# Patient Record
Sex: Male | Born: 1995 | Race: White | Hispanic: No | Marital: Single | Smoking: Former smoker
Health system: Southern US, Community
[De-identification: ages and names within clinical notes are randomized; demographics above are authoritative.]

## PROBLEM LIST (undated history)

## (undated) HISTORY — PX: TYMPANOSTOMY TUBE PLACEMENT: SHX32

---
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
## 2017-06-13 ENCOUNTER — Encounter (HOSPITAL_COMMUNITY): Payer: Self-pay | Admitting: *Deleted

## 2017-06-13 ENCOUNTER — Ambulatory Visit (HOSPITAL_COMMUNITY)
Admission: EM | Admit: 2017-06-13 | Discharge: 2017-06-13 | Disposition: A | Discharge status: Nursing Home (Includes ICF) | Payer: Medicaid Other | Attending: Family Medicine | Admitting: Family Medicine

## 2017-06-13 ENCOUNTER — Ambulatory Visit (INDEPENDENT_AMBULATORY_CARE_PROVIDER_SITE_OTHER): Payer: Medicaid Other

## 2017-06-13 ENCOUNTER — Encounter (HOSPITAL_COMMUNITY): Payer: Self-pay | Admitting: Emergency Medicine

## 2017-06-13 ENCOUNTER — Emergency Department (HOSPITAL_COMMUNITY): Payer: Medicaid Other

## 2017-06-13 ENCOUNTER — Emergency Department (HOSPITAL_COMMUNITY)
Admission: EM | Admit: 2017-06-13 | Discharge: 2017-06-13 | Disposition: A | Discharge status: Home / Self Care | Payer: Medicaid Other | Attending: Emergency Medicine | Admitting: Emergency Medicine

## 2017-06-13 ENCOUNTER — Other Ambulatory Visit: Payer: Self-pay

## 2017-06-13 DIAGNOSIS — Z87891 Personal history of nicotine dependence: Secondary | ICD-10-CM | POA: Insufficient documentation

## 2017-06-13 DIAGNOSIS — R109 Unspecified abdominal pain: Secondary | ICD-10-CM | POA: Diagnosis present

## 2017-06-13 DIAGNOSIS — R0789 Other chest pain: Secondary | ICD-10-CM

## 2017-06-13 DIAGNOSIS — J982 Interstitial emphysema: Secondary | ICD-10-CM | POA: Insufficient documentation

## 2017-06-13 DIAGNOSIS — T797XXA Traumatic subcutaneous emphysema, initial encounter: Secondary | ICD-10-CM | POA: Diagnosis not present

## 2017-06-13 DIAGNOSIS — M542 Cervicalgia: Secondary | ICD-10-CM | POA: Diagnosis not present

## 2017-06-13 DIAGNOSIS — R05 Cough: Secondary | ICD-10-CM | POA: Diagnosis not present

## 2017-06-13 DIAGNOSIS — R079 Chest pain, unspecified: Secondary | ICD-10-CM | POA: Diagnosis not present

## 2017-06-13 LAB — CBC
HCT: 42 % (ref 39.0–52.0)
Hemoglobin: 14.3 g/dL (ref 13.0–17.0)
MCH: 31.8 pg (ref 26.0–34.0)
MCHC: 34 g/dL (ref 30.0–36.0)
MCV: 93.5 fL (ref 78.0–100.0)
PLATELETS: 157 10*3/uL (ref 150–400)
RBC: 4.49 MIL/uL (ref 4.22–5.81)
RDW: 13 % (ref 11.5–15.5)
WBC: 7.4 10*3/uL (ref 4.0–10.5)

## 2017-06-13 LAB — BASIC METABOLIC PANEL
Anion gap: 7 (ref 5–15)
BUN: 8 mg/dL (ref 6–20)
CHLORIDE: 108 mmol/L (ref 101–111)
CO2: 24 mmol/L (ref 22–32)
CREATININE: 0.97 mg/dL (ref 0.61–1.24)
Calcium: 9.3 mg/dL (ref 8.9–10.3)
GFR calc Af Amer: >60 mL/min (ref >60)
GFR calc non Af Amer: >60 mL/min (ref >60)
Glucose, Bld: 104 mg/dL — ABNORMAL HIGH (ref 65–99)
Potassium: 4 mmol/L (ref 3.5–5.1)
SODIUM: 139 mmol/L (ref 135–145)

## 2017-06-13 MED ORDER — MORPHINE SULFATE (PF) 4 MG/ML IV SOLN
4.0000 mg | Freq: Once | INTRAVENOUS | Status: AC
  Administered 2017-06-13: 4 mg via INTRAVENOUS
  Filled 2017-06-13: qty 1

## 2017-06-13 MED ORDER — KETOROLAC TROMETHAMINE 30 MG/ML IJ SOLN
30.0000 mg | Freq: Once | INTRAMUSCULAR | Status: AC
  Administered 2017-06-13: 30 mg via INTRAVENOUS
  Filled 2017-06-13: qty 1

## 2017-06-13 MED ORDER — OXYCODONE-ACETAMINOPHEN 5-325 MG PO TABS: 1.0000 | ORAL_TABLET | ORAL | 0 refills | Status: AC | PRN

## 2017-06-13 MED ORDER — ONDANSETRON HCL 4 MG/2ML IJ SOLN
4.0000 mg | Freq: Once | INTRAMUSCULAR | Status: AC
  Administered 2017-06-13: 4 mg via INTRAVENOUS
  Filled 2017-06-13: qty 2

## 2017-06-13 MED ORDER — IOPAMIDOL (ISOVUE-300) INJECTION 61%
INTRAVENOUS | Status: AC
  Administered 2017-06-13: 100 mL
  Filled 2017-06-13: qty 100

## 2017-06-13 NOTE — ED Notes (Signed)
Patient transported to CT 

## 2017-06-13 NOTE — ED Triage Notes (Addendum)
Pt reports a cough x1 week.  He states he started having chest pressure last night and anterior neck pain.  He also reports loss of hearing in his right ear, stating "it feels like there is a cup over it."  Pt denies any fever.  He states he is suppressing his cough because of the pain and finds it hard to take in a deep breath because of the pain in his chest.  Pt took cocaine and methamphetamines about a week and a half ago for the first time.

## 2017-06-13 NOTE — ED Notes (Signed)
Pt stable, ambulatory, states understanding of discharge instructions 

## 2017-06-13 NOTE — ED Provider Notes (Signed)
[facility] EMERGENCY DEPARTMENT Provider Note   CSN: [id number] Arrival date & time: 06/13/17  1329     History   Chief Complaint Chief Complaint  Patient presents with  . Chest Pain    HPI George Callahan is a 21 y.o. male.  Patient is a transfer from urgent care center.  He reports chest pain and a sensation of crackling in his neck after lifting weights recently.  He does smoke cigarettes and drinks rarely.  Otherwise he is healthy.  Chest x-ray at the urgent care center revealed pneumomediastinum.  He was referred to the emergency department for further evaluation.  Severity of symptoms is moderate.  Nothing makes symptoms better or worse.  No crushing substernal chest pain, dyspnea, diaphoresis, nausea.      History reviewed. No pertinent past medical history.  There are no active problems to display for this patient.   Past Surgical History:  Procedure Laterality Date  . TYMPANOSTOMY TUBE PLACEMENT Bilateral        Home Medications    Prior to Admission medications   Medication Sig Start Date End Date Taking? Authorizing Provider  ibuprofen (ADVIL,MOTRIN) 600 MG tablet Take 600 mg every 6 (six) hours as needed by mouth.   Yes [provider]  Multiple Vitamin (MULTIVITAMIN) tablet Take 1 tablet daily by mouth.   Yes [provider]  naproxen (NAPROSYN) 500 MG tablet Take 1 tablet (500 mg total) by mouth 2 (two) times daily. Patient not taking: Reported on 06/13/2017 08/18/16   Fayrene Helperran, [place], PA-C  oxyCODONE-acetaminophen (PERCOCET) 5-325 MG tablet Take 1 tablet every 4 (four) hours as needed by mouth. 06/13/17   Donnetta Hutchingook, Amity Roes, MD    Family History History reviewed. No pertinent family history.  Social History Social History   Tobacco Use  . Smoking status: Former Smoker    Packs/day: 0.50    Types: Cigarettes    Last attempt to quit: 05/30/2017    Years since quitting: 0.0  . Smokeless tobacco: Never Used  Substance Use  Topics  . Alcohol use: Yes    Comment: occasional  . Drug use: Yes    Types: Methamphetamines, Cocaine    Comment: states it was one time     Allergies   Patient has no known allergies.   Review of Systems Review of Systems  All other systems reviewed and are negative.    Physical Exam Updated Vital Signs BP 120/74 (BP Location: Right Arm)   Pulse 88   Temp 98.6 F (37 C) (Oral)   Resp 16   SpO2 100%   Physical Exam  Constitutional: He is oriented to person, place, and time. He appears well-developed and well-nourished.  nad  HENT:  Head: Normocephalic and atraumatic.  Eyes: Conjunctivae are normal.  Neck: Neck supple.  Crepitus in the neck noted.  Cardiovascular: Normal rate and regular rhythm.  Pulmonary/Chest: Effort normal and breath sounds normal.  Abdominal: Soft. Bowel sounds are normal.  Musculoskeletal: Normal range of motion.  Neurological: He is alert and oriented to person, place, and time.  Skin: Skin is warm and dry.  Psychiatric: He has a normal mood and affect. His behavior is normal.  Nursing note and vitals reviewed.    ED Treatments / Results  Labs (all labs ordered are listed, but only abnormal results are displayed) Labs Reviewed  BASIC METABOLIC PANEL - Abnormal; Notable for the following components:      Result Value   Glucose, Bld 104 (*)  All other components within normal limits  CBC    EKG  EKG Interpretation None       Radiology Dg Chest 2 View  Result Date: 06/13/2017 CLINICAL DATA:  Chest pain EXAM: CHEST  2 VIEW COMPARISON:  None. FINDINGS: Subcutaneous emphysema seen over the right neck with associated pneumomediastinum. Lungs are clear. No pneumothorax seen. No pleural effusion. Heart size and mediastinal contours are stable. IMPRESSION: 1. Subcutaneous emphysema over the right neck and upper right chest. 2. Associated pneumomediastinum. 3. Lungs are clear.  No pneumothorax seen. Electronically Signed   By: Bary Richard M.D.   On: 06/13/2017 12:51   Ct Soft Tissue Neck W Contrast  Result Date: 06/13/2017 CLINICAL DATA:  21 year old male with recent cough. Chest and neck pain. Pneumomediastinum and right neck subcutaneous emphysema discovered on chest radiographs today. EXAM: CT NECK WITH CONTRAST TECHNIQUE: Multidetector CT imaging of the neck was performed using the standard protocol following the bolus administration of intravenous contrast. CONTRAST:  ISOVUE-300 IOPAMIDOL (ISOVUE-300) INJECTION 61% in conjunction with contrast enhanced imaging of the chest reported separately. COMPARISON:  Chest CT and radiographs today reported separately. Cervical spine radiographs 08/18/2016. FINDINGS: Pharynx and larynx: The glottis is closed. Laryngeal soft tissue contours are otherwise normal. The epiglottis is normal. Pharyngeal soft tissue contours are within normal limits for age ; there is symmetric appearing palatine tonsil and adenoid hypertrophy. There is gas throughout the retropharyngeal space. No retropharyngeal fluid is identified. The parapharyngeal spaces are spared and negative. Salivary glands: Negative sublingual space, submandibular spaces, parotid spaces, submandibular glands, sublingual glands, and parotid glands. Thyroid: Gas surrounds the thyroid gland which otherwise appears normal. Lymph nodes: Mild left greater than right level 2 lymph node enlargement considering this patient age. The largest is a 13 mm short axis left level IIa node while that on the right measures up to 11 mm short axis. Other nodal stations -aside from bilateral subcutaneous gas- appear normal. No cystic or necrotic nodes are identified. Vascular: Bilateral carotid space gas tracking to the skull base, slightly greater on the right. Major vascular structures in the neck and at the skullbase are patent and otherwise appear normal. Limited intracranial: Negative. Visualized orbits: Negative. Mastoids and visualized paranasal  sinuses: Minor right maxillary sinus mucosal thickening or small mucous retention cysts. Other visible paranasal sinuses and mastoids are stable and well pneumatized. Bilateral tympanic cavities are clear. Skeleton: Mild reversal of cervical lordosis. No osseous abnormality identified. Upper chest: Pneumomediastinum and multi spatial gas at the thoracic inlet, greater on the right. Chest findings today are reported separately. IMPRESSION: 1. Multi spatial deep soft tissue gas in the neck most likely represents cephalad extension of pulmonary/pneumomediastinum related gas - which is most often self limited. See Chest CT findings today reported separately. 2. Mild for age tonsillar and level 2 lymph node enlargement could be physiologic or reflect a URI. No neck abscess or fluid collection. 3. Otherwise negative neck CT. Electronically Signed   By: Odessa Fleming M.D.   On: 06/13/2017 17:06   Ct Chest W Contrast  Result Date: 06/13/2017 CLINICAL DATA:  Chest pain. Pneumomediastinum on plain film. Cough for 1 week. Loss of hearing in right ear. Recent drug use. EXAM: CT CHEST WITH CONTRAST TECHNIQUE: Multidetector CT imaging of the chest was performed during intravenous contrast administration. CONTRAST:  ISOVUE-300 IOPAMIDOL (ISOVUE-300) INJECTION 61% COMPARISON:  Plain film of earlier today. FINDINGS: Cardiovascular: Normal aortic caliber. Normal heart size, without pericardial effusion. Mediastinum/Nodes: Subcutaneous air within  the neck and right-sided chest. No mediastinal or hilar adenopathy. Suspect residual thymic tissue in the anterior mediastinum. No specific evidence of esophageal injury (esophagus not contrast filled). Lungs/Pleura: No pleural fluid. No pneumothorax. Extensive pneumomediastinum. No endobronchial tree defect identified. Perifissural nodularity on the right is likely due to subpleural lymph nodes, given patient age. No lobar consolidation. Upper Abdomen: Normal imaged portions of the  liver, spleen, stomach, pancreas, gallbladder, biliary tract, adrenal glands, kidneys. Musculoskeletal: No acute osseous abnormality. IMPRESSION: 1. Extensive pneumomediastinum and subcutaneous air, as on plain films. No cause identified. 2. No other acute process in the chest. Electronically Signed   By: Jeronimo GreavesKyle  Talbot M.D.   On: 06/13/2017 17:04    Procedures Procedures (including critical care time)  Medications Ordered in ED Medications  ketorolac (TORADOL) 30 MG/ML injection 30 mg (not administered)  iopamidol (ISOVUE-300) 61 % injection (100 mLs  Contrast Given 06/13/17 1632)  morphine 4 MG/ML injection 4 mg (4 mg Intravenous Given 06/13/17 1652)  ondansetron (ZOFRAN) injection 4 mg (4 mg Intravenous Given 06/13/17 1652)  morphine 4 MG/ML injection 4 mg (4 mg Intravenous Given 06/13/17 1820)     Initial Impression / Assessment and Plan / ED Course  I have reviewed the triage vital signs and the nursing notes.  Pertinent labs & imaging results that were available during my care of the patient were reviewed by me and considered in my medical decision making (see chart for details).     Patient presents with chest pain.  CT of the chest/neck reveal pneumomediastinum with extension into the neck.  He is hemodynamically stable.  I discussed these findings with the thoracic surgeon on call.  No further intervention is necessary at this time.  We will treat his pain with IV morphine in the ED.  Discharge medication Percocet.  Discussed with the patient in great detail.  Final Clinical Impressions(s) / ED Diagnoses   Final diagnoses:  Pneumomediastinum [facility](HCC)    ED Discharge Orders        Ordered    oxyCODONE-acetaminophen (PERCOCET) 5-325 MG tablet  Every 4 hours PRN     06/13/17 2026       Donnetta Hutchingook, Ziah Leandro, MD 06/15/17 1352

## 2017-06-13 NOTE — ED Provider Notes (Signed)
[facility]    CSN: [id number] Arrival date & time: 06/13/17  1147     History   Chief Complaint Chief Complaint  Patient presents with  . Chest Pain  . Neck Pain  . Cough  . Hearing Loss    right    HPI George Callahan is a 21 y.o. male.   21 year old male presents to the urgent care stating that last evening he developed rather severe anterior chest pain. It is exacerbated and elicited by cough, deep breaths and movement. He also complains of pain to the anterior neck and when he rubs the right neck he can hear bubbles crackling under the skin. He denies sore throat. Denies fever or chills. When asked if he had shortness of breath he hesitated and stated he thought he might have some. Currently afebrile in no acute distress.      History reviewed. No pertinent past medical history.  There are no active problems to display for this patient.   Past Surgical History:  Procedure Laterality Date  . TYMPANOSTOMY TUBE PLACEMENT Bilateral        Home Medications    Prior to Admission medications   Medication Sig Start Date End Date Taking? Authorizing Provider  ibuprofen (ADVIL,MOTRIN) 600 MG tablet Take 600 mg every 6 (six) hours as needed by mouth.   Yes [provider]  naproxen (NAPROSYN) 500 MG tablet Take 1 tablet (500 mg total) by mouth 2 (two) times daily. 08/18/16   Fayrene Helperran, [place], PA-C    Family History History reviewed. No pertinent family history.  Social History Social History   Tobacco Use  . Smoking status: Former Smoker    Packs/day: 0.50    Types: Cigarettes    Last attempt to quit: 05/30/2017    Years since quitting: 0.0  . Smokeless tobacco: Never Used  Substance Use Topics  . Alcohol use: Yes    Comment: occasional  . Drug use: Yes    Types: Methamphetamines, Cocaine    Comment: states it was one time     Allergies   Patient has no known allergies.   Review of Systems Review of Systems  Constitutional:  Positive for activity change. Negative for fever.  HENT: Negative for facial swelling, nosebleeds, postnasal drip, rhinorrhea, sore throat and trouble swallowing.        As per history of present illness  Eyes: Negative.   Respiratory: Positive for shortness of breath.   Cardiovascular: Positive for chest pain. Negative for leg swelling.  Gastrointestinal: Negative for abdominal pain.  Musculoskeletal: Positive for neck pain.       Bilateral anterior neck pain and soreness.  Skin: Negative.   Neurological: Negative.   All other systems reviewed and are negative.    Physical Exam Triage Vital Signs ED Triage Vitals  Enc Vitals Group     BP 06/13/17 1204 122/66     Pulse Rate 06/13/17 1204 84     Resp --      Temp 06/13/17 1204 98.8 F (37.1 C)     Temp Source 06/13/17 1204 Oral     SpO2 06/13/17 1204 99 %     Weight --      Height --      Head Circumference --      Peak Flow --      Pain Score 06/13/17 1201 8     Pain Loc --      Pain Edu? --      Excl. in GC? --  No data found.  Updated Vital Signs BP 122/66 (BP Location: Left Arm)   Pulse 84   Temp 98.8 F (37.1 C) (Oral)   SpO2 99%   Visual Acuity Right Eye Distance:   Left Eye Distance:   Bilateral Distance:    Right Eye Near:   Left Eye Near:    Bilateral Near:     Physical Exam  Constitutional: He is oriented to person, place, and time. He appears well-developed and well-nourished.  Non-toxic appearance. He does not appear ill. No distress.  HENT:  Head: Normocephalic and atraumatic.  Eyes: EOM are normal.  Neck: Normal range of motion. No tracheal deviation present.  Tenderness to the entire anterior neck bilaterally. Tenderness over the thyroid and cricoid cartilage. Tenderness to the soft tissues. Patient is able to press on the right side of his neck move his fingers up along the neck and produce crackling sounds similar to that which is hard and subcutaneous emphysema. This is repeated on my  exam. No bulging or swelling. No asymmetry to the neck. Positive for generalized tenderness including some small tender anterior cervical lymph nodes. Tender along the anterolateral neck musculature.  The throat appears a little irritated, possibly from cough but no exudate or swelling. No appreciable PND.  Cardiovascular: Normal rate, regular rhythm and normal pulses.  No murmur heard. Exquisite diffuse bilateral anterior chest wall pain even with light palpation. Light palpation to the ribs and intercostal muscles causes the patient to wence and grimace while he states that the pain is severe. No asymmetry. No swelling of the chest is seen. No palpable subcutaneous emphysema of the anterior chest supra or infraclavicular.  Pulmonary/Chest: Effort normal and breath sounds normal. No stridor. No tachypnea.  No adventitious sounds within the lungs. Excellent air movement and chest expansion. Inspiratory and expiratory phases are equal.  Musculoskeletal: Normal range of motion.  Neurological: He is alert and oriented to person, place, and time.  Skin: Skin is warm and dry.  Psychiatric: His mood appears anxious.  Nursing note and vitals reviewed.    UC Treatments / Results  Labs (all labs ordered are listed, but only abnormal results are displayed) Labs Reviewed - No data to display  EKG  EKG Interpretation None       Radiology Dg Chest 2 View  Result Date: 06/13/2017 CLINICAL DATA:  Chest pain EXAM: CHEST  2 VIEW COMPARISON:  None. FINDINGS: Subcutaneous emphysema seen over the right neck with associated pneumomediastinum. Lungs are clear. No pneumothorax seen. No pleural effusion. Heart size and mediastinal contours are stable. IMPRESSION: 1. Subcutaneous emphysema over the right neck and upper right chest. 2. Associated pneumomediastinum. 3. Lungs are clear.  No pneumothorax seen. Electronically Signed   By: Bary RichardStan  Maynard M.D.   On: 06/13/2017 12:51    Procedures Procedures  (including critical care time)  Medications Ordered in UC Medications - No data to display   Initial Impression / Assessment and Plan / UC Course  I have reviewed the triage vital signs and the nursing notes.  Pertinent labs & imaging results that were available during my care of the patient were reviewed by me and considered in my medical decision making (see chart for details).    Go to the emergency department now for evaluation of chest pain and air in the chest and neck.    Final Clinical Impressions(s) / UC Diagnoses   Final diagnoses:  Pneumomediastinum (HCC)  Chest wall pain  Anterior neck pain  Subcutaneous emphysema,  initial encounter [facility])    ED Discharge Orders    None       Controlled Substance Prescriptions [place] Controlled Substance Registry consulted? Not Applicable   Hayden Rasmussen, NP 06/13/17 1316

## 2017-06-13 NOTE — Discharge Instructions (Signed)
Go to the emergency department now for evaluation of chest pain and air in the chest and neck.

## 2017-06-13 NOTE — Discharge Instructions (Signed)
Your tests reveal air in your chest and neck called pneumomediastinum.  This should resolve itself over time.  Prescription for pain medicine.  Return if worse.

## 2017-06-13 NOTE — ED Triage Notes (Signed)
Pt sent here from [facility]. Reports onset last night of right side chest pain and sob after working out. Had abnormal chest xray done that showed air on right side of chest and neck. Airway is intact at triage and no acute distress is noted.

## 2017-06-19 ENCOUNTER — Encounter (HOSPITAL_COMMUNITY): Payer: Self-pay | Admitting: Emergency Medicine

## 2017-06-19 ENCOUNTER — Emergency Department (HOSPITAL_COMMUNITY): Payer: Medicaid Other

## 2017-06-19 ENCOUNTER — Emergency Department (HOSPITAL_COMMUNITY)
Admission: EM | Admit: 2017-06-19 | Discharge: 2017-06-20 | Disposition: A | Discharge status: Home / Self Care | Payer: Medicaid Other | Attending: Emergency Medicine | Admitting: Emergency Medicine

## 2017-06-19 DIAGNOSIS — Z87891 Personal history of nicotine dependence: Secondary | ICD-10-CM | POA: Diagnosis not present

## 2017-06-19 DIAGNOSIS — R55 Syncope and collapse: Secondary | ICD-10-CM

## 2017-06-19 DIAGNOSIS — H538 Other visual disturbances: Secondary | ICD-10-CM | POA: Insufficient documentation

## 2017-06-19 DIAGNOSIS — R0789 Other chest pain: Secondary | ICD-10-CM | POA: Diagnosis not present

## 2017-06-19 DIAGNOSIS — Z79899 Other long term (current) drug therapy: Secondary | ICD-10-CM | POA: Insufficient documentation

## 2017-06-19 DIAGNOSIS — Z8709 Personal history of other diseases of the respiratory system: Secondary | ICD-10-CM | POA: Insufficient documentation

## 2017-06-19 LAB — BASIC METABOLIC PANEL
Anion gap: 10 (ref 5–15)
BUN: 8 mg/dL (ref 6–20)
CO2: 24 mmol/L (ref 22–32)
Calcium: 9.5 mg/dL (ref 8.9–10.3)
Chloride: 104 mmol/L (ref 101–111)
Creatinine, Ser: 0.86 mg/dL (ref 0.61–1.24)
GFR calc Af Amer: >60 mL/min (ref >60)
GLUCOSE: 92 mg/dL (ref 65–99)
POTASSIUM: 3.7 mmol/L (ref 3.5–5.1)
Sodium: 138 mmol/L (ref 135–145)

## 2017-06-19 LAB — I-STAT TROPONIN, ED: Troponin i, poc: 0.01 ng/mL (ref 0.00–0.08)

## 2017-06-19 LAB — CBC
HEMATOCRIT: 44 % (ref 39.0–52.0)
Hemoglobin: 14.9 g/dL (ref 13.0–17.0)
MCH: 31.6 pg (ref 26.0–34.0)
MCHC: 33.9 g/dL (ref 30.0–36.0)
MCV: 93.4 fL (ref 78.0–100.0)
Platelets: 183 10*3/uL (ref 150–400)
RBC: 4.71 MIL/uL (ref 4.22–5.81)
RDW: 12.7 % (ref 11.5–15.5)
WBC: 6.6 10*3/uL (ref 4.0–10.5)

## 2017-06-19 LAB — D-DIMER, QUANTITATIVE (NOT AT ARMC)

## 2017-06-19 MED ORDER — IBUPROFEN 400 MG PO TABS
400.0000 mg | ORAL_TABLET | Freq: Once | ORAL | Status: AC
  Administered 2017-06-19: 400 mg via ORAL
  Filled 2017-06-19: qty 1

## 2017-06-19 MED ORDER — MORPHINE SULFATE (PF) 4 MG/ML IV SOLN
4.0000 mg | Freq: Once | INTRAVENOUS | Status: AC
  Administered 2017-06-20: 4 mg via INTRAVENOUS
  Filled 2017-06-19: qty 1

## 2017-06-19 MED ORDER — OXYCODONE-ACETAMINOPHEN 5-325 MG PO TABS: 1.0000 | ORAL_TABLET | Freq: Once | ORAL | Status: DC

## 2017-06-19 NOTE — ED Notes (Signed)
Pt has asked multiple people, including this RN for additional pain medication.  This RN reminded patient of need to avoid narcotics due to his syncopal episode and apologized for delays until patient can see provider.

## 2017-06-19 NOTE — ED Triage Notes (Signed)
Pt presents to ED for assessment of left sided chest pain.  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  Pt states he went to stand up and "everything went black" and patient sat down on the ground, then "came to almost immediately".  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

## 2017-06-20 LAB — I-STAT TROPONIN, ED: Troponin i, poc: 0 ng/mL (ref 0.00–0.08)

## 2017-06-20 MED ORDER — HYDROCODONE-ACETAMINOPHEN 5-325 MG PO TABS
1.0000 | ORAL_TABLET | Freq: Four times a day (QID) | ORAL | 0 refills | Status: AC | PRN
Start: 2017-06-20 — End: ?

## 2017-06-20 MED ORDER — MORPHINE SULFATE (PF) 4 MG/ML IV SOLN
4.0000 mg | Freq: Once | INTRAVENOUS | Status: AC
  Administered 2017-06-20: 4 mg via INTRAVENOUS
  Filled 2017-06-20: qty 1

## 2017-06-20 NOTE — ED Provider Notes (Signed)
[facility] Provider Note   CSN: [id number] Arrival date & time: 06/19/17  1715     History   Chief Complaint Chief Complaint  Patient presents with  . Loss of Consciousness  . Chest Pain    HPI George Callahan is a 21 y.o. male.  Patient presents to the emergency department with a chief complaint of near syncope.  He states that he was at work, and felt like he was going to pass out.  States that his vision was blurred and he began to black out.  He states that he collapsed to the ground, but reports that he never lost consciousness completely.  He can recall the entire incident.  Additionally, patient was recently seen for pneumomediastinum.  He reports that he has residual pain from this.  States that his pain keeps him from working.  He has run out of his pain medicine.  States that he did have a dry cough prior to developing the pneumomediastinum, but reports that he was never told why he developed it.   The history is provided by the patient. No language interpreter was used.    History reviewed. No pertinent past medical history.  There are no active problems to display for this patient.   Past Surgical History:  Procedure Laterality Date  . TYMPANOSTOMY TUBE PLACEMENT Bilateral        Home Medications    Prior to Admission medications   Medication Sig Start Date End Date Taking? Authorizing Provider  Multiple Vitamin (MULTIVITAMIN) tablet Take 1 tablet daily by mouth.   Yes [provider]  oxyCODONE-acetaminophen (PERCOCET) 5-325 MG tablet Take 1 tablet every 4 (four) hours as needed by mouth. 06/13/17  Yes Donnetta Hutchingook, Brian, MD  naproxen (NAPROSYN) 500 MG tablet Take 1 tablet (500 mg total) by mouth 2 (two) times daily. Patient not taking: Reported on 06/13/2017 08/18/16   Fayrene Helperran, [place], PA-C    Family History History reviewed. No pertinent family history.  Social History Social History   Tobacco Use  . Smoking  status: Former Smoker    Packs/day: 0.50    Types: Cigarettes    Last attempt to quit: 05/30/2017    Years since quitting: 0.0  . Smokeless tobacco: Never Used  Substance Use Topics  . Alcohol use: Yes    Comment: occasional  . Drug use: Yes    Types: Methamphetamines, Cocaine    Comment: states it was one time     Allergies   Patient has no known allergies.   Review of Systems Review of Systems  All other systems reviewed and are negative.    Physical Exam Updated Vital Signs BP 135/81   Pulse 78   Temp 98.1 F (36.7 C) (Oral)   Resp (!) 28   Ht 5\' 11"  (1.803 m)   Wt 65.3 kg (144 lb)   SpO2 99%   BMI 20.08 kg/m   Physical Exam  Constitutional: He is oriented to person, place, and time. He appears well-developed and well-nourished.  HENT:  Head: Normocephalic and atraumatic.  Eyes: Conjunctivae and EOM are normal. Pupils are equal, round, and reactive to light. Right eye exhibits no discharge. Left eye exhibits no discharge. No scleral icterus.  Neck: Normal range of motion. Neck supple. No JVD present.  Cardiovascular: Normal rate, regular rhythm and normal heart sounds. Exam reveals no gallop and no friction rub.  No murmur heard. Pulmonary/Chest: Effort normal and breath sounds normal. No respiratory distress. He has no  wheezes. He has no rales. He exhibits no tenderness.  No crepitus, but there is tenderness to palpation anteriorly  Abdominal: Soft. He exhibits no distension and no mass. There is no tenderness. There is no rebound and no guarding.  Musculoskeletal: Normal range of motion. He exhibits no edema or tenderness.  Neurological: He is alert and oriented to person, place, and time.  Skin: Skin is warm and dry.  Psychiatric: He has a normal mood and affect. His behavior is normal. Judgment and thought content normal.  Nursing note and vitals reviewed.    ED Treatments / Results  Labs (all labs ordered are listed, but only abnormal results are  displayed) Labs Reviewed  BASIC METABOLIC PANEL  CBC  D-DIMER, QUANTITATIVE (NOT [facility])  I-STAT TROPONIN, ED  I-STAT TROPONIN, ED    EKG  EKG Interpretation  Date/Time:  Tuesday June 19 2017 17:19:22 EST Ventricular Rate:  95 PR Interval:  106 QRS Duration: 86 QT Interval:  334 QTC Calculation: 419 R Axis:   82 Text Interpretation:  Sinus rhythm with sinus arrhythmia with short PR Right atrial enlargement Borderline ECG No previous ECGs available Confirmed by Glynn Octaveancour, Stephen [phone]([id number]) on 104/23/202018 11:17:10 PM       Radiology Dg Chest 2 View  Result Date: 104/23/202018 CLINICAL DATA:  Chest pain. EXAM: CHEST  2 VIEW COMPARISON:  June 13, 2017 FINDINGS: The heart size and mediastinal contours are within normal limits. There is no focal infiltrate, pulmonary edema, or pleural effusion. The visualized skeletal structures are unremarkable. IMPRESSION: No active cardiopulmonary disease. Electronically Signed   By: Sherian ReinWei-Chen  Lin M.D.   On: 104/23/202018 18:36    Procedures Procedures (including critical care time)  Medications Ordered in ED Medications  ibuprofen (ADVIL,MOTRIN) tablet 400 mg (400 mg Oral Given 06/19/17 1733)  morphine 4 MG/ML injection 4 mg (4 mg Intravenous Given 06/20/17 0022)     Initial Impression / Assessment and Plan / ED Course  I have reviewed the triage vital signs and the nursing notes.  Pertinent labs & imaging results that were available during my care of the patient were reviewed by me and considered in my medical decision making (see chart for details).     Patient with persistent chest pain since being diagnosed with pneumomediastinum about a week ago.  Chest x-ray today shows no pneumomediastinum.  Patient also here because he had a near syncopal episode today.  He is not orthostatic.  D-dimer is negative.  Vital signs are stable.  He is not anemic.  EKG shows no concerning arrhythmias or abnormalities, this was reviewed with Dr. Manus Gunningancour.   Will treat patient's pain.  Patient seen by and discussed with Dr. Manus Gunningancour, who recommends repeat troponin, which if normal, the patient can be discharged.  Final Clinical Impressions(s) / ED Diagnoses   Final diagnoses:  Near syncope    ED Discharge Orders    None       Roxy HorsemanBrowning, Tanya Marvin, PA-C 06/20/17 0602    Glynn Octaveancour, Stephen, MD 06/20/17 [phone]

## 2018-09-07 DEATH — deceased

## 2019-06-03 IMAGING — DX DG CHEST 2V
2 series · 2 of 2 positions shown · non-contrast
Comparison: June 13, 2017

CLINICAL DATA: Chest pain.

EXAM:
CHEST  2 VIEW

[chest pa]
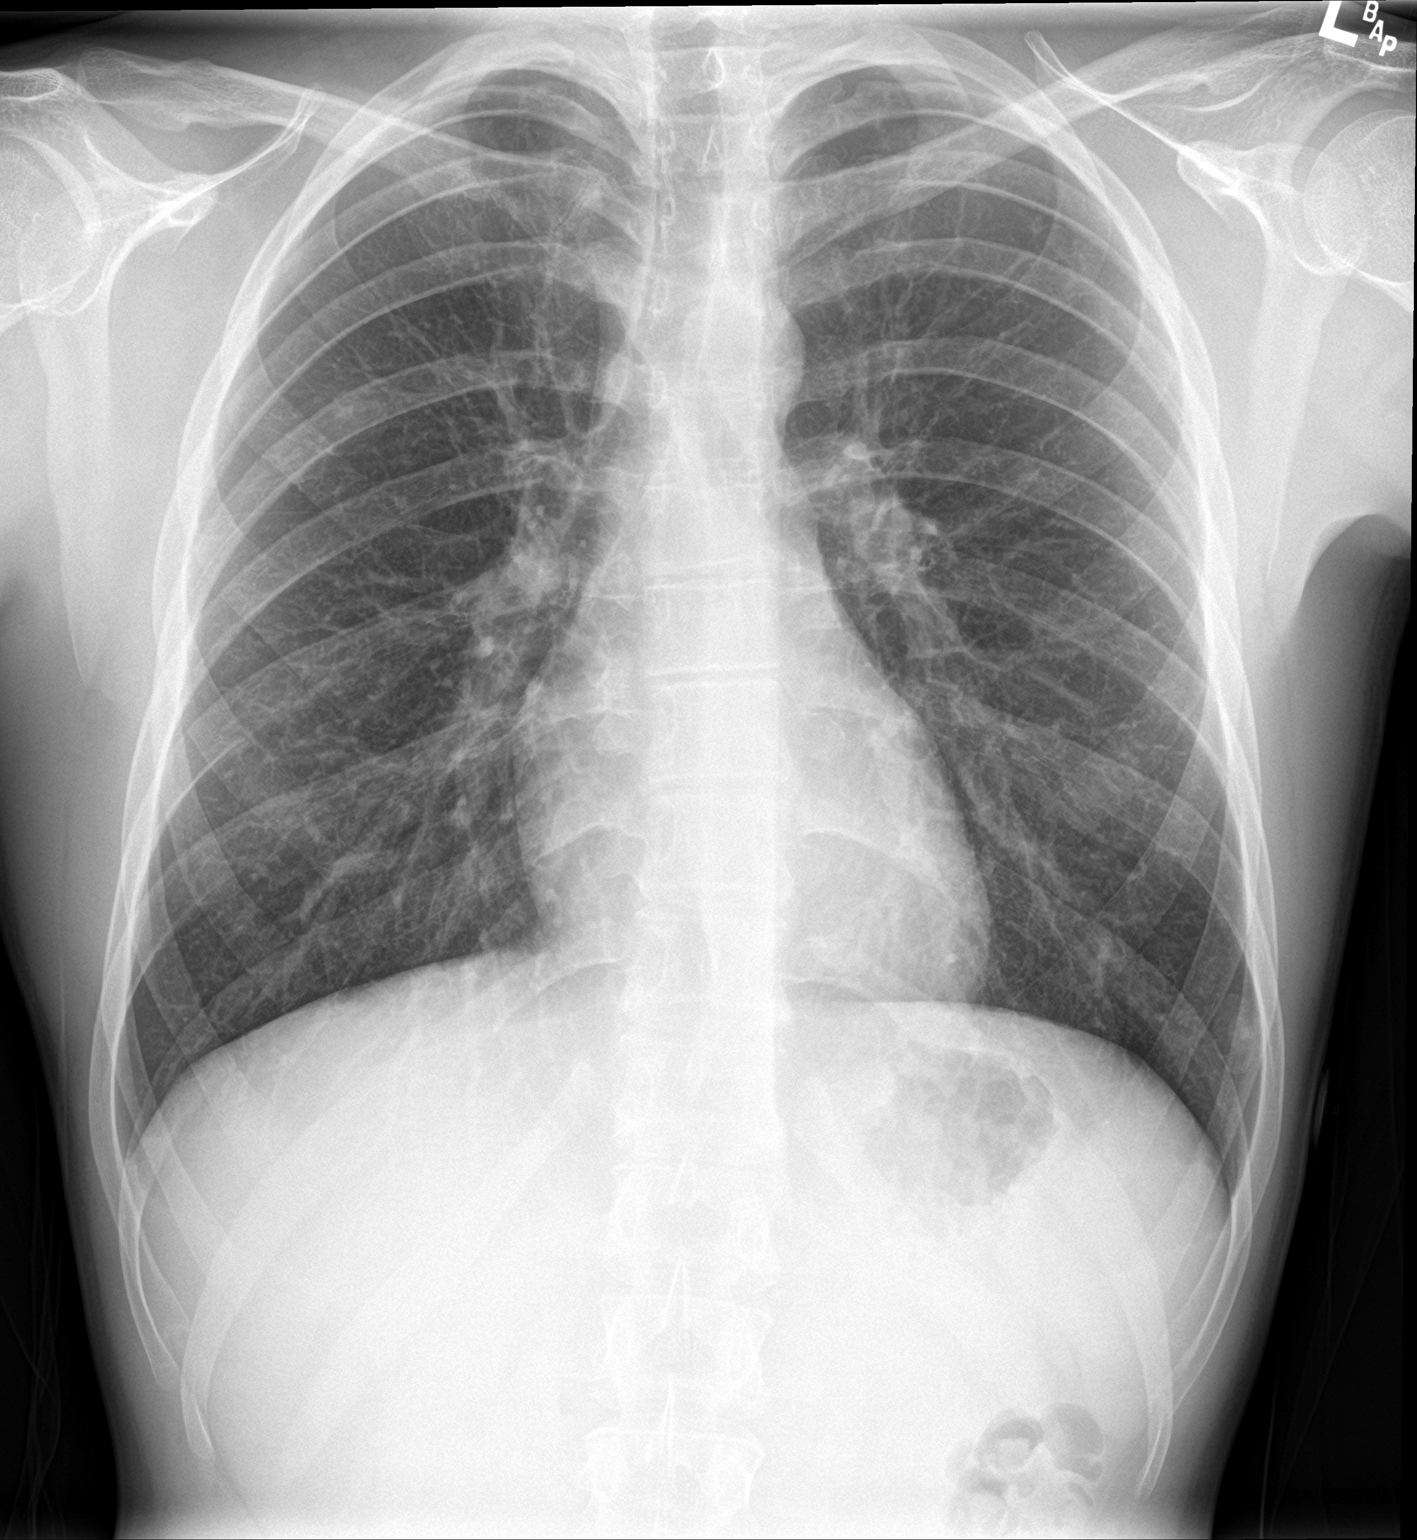

[chest lat]
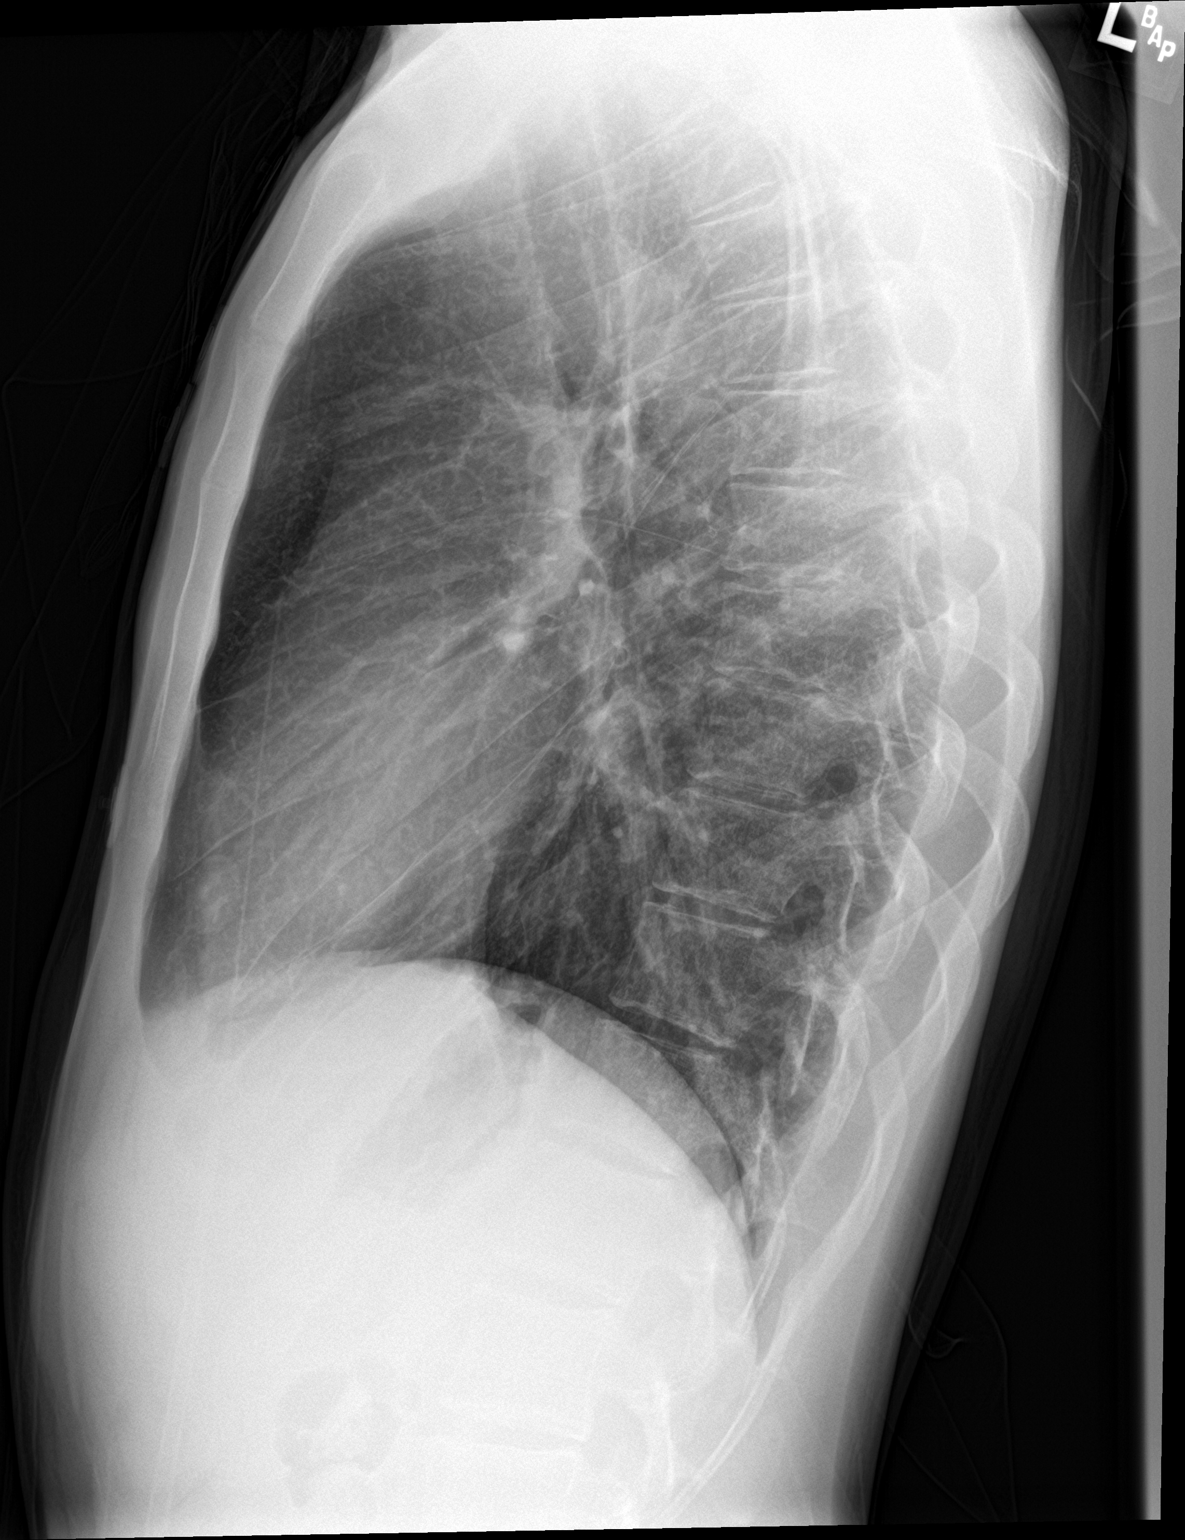

[2 of 2 positions shown; findings below may reference images not displayed]

FINDINGS: The heart size and mediastinal contours are within normal limits.
There is no focal infiltrate, pulmonary edema, or pleural effusion.
The visualized skeletal structures are unremarkable.
IMPRESSION: No active cardiopulmonary disease.
